# Patient Record
Sex: Female | Born: 1950 | Race: White | Hispanic: No | Marital: Married | Smoking: Former smoker
Health system: Southern US, Community
[De-identification: ages and names within clinical notes are randomized; demographics above are authoritative.]

## PROBLEM LIST (undated history)

## (undated) DIAGNOSIS — R32 Unspecified urinary incontinence: Secondary | ICD-10-CM

## (undated) DIAGNOSIS — T7840XA Allergy, unspecified, initial encounter: Secondary | ICD-10-CM

## (undated) DIAGNOSIS — F1921 Other psychoactive substance dependence, in remission: Secondary | ICD-10-CM

## (undated) DIAGNOSIS — F329 Major depressive disorder, single episode, unspecified: Secondary | ICD-10-CM

## (undated) DIAGNOSIS — F32A Depression, unspecified: Secondary | ICD-10-CM

## (undated) DIAGNOSIS — Z8619 Personal history of other infectious and parasitic diseases: Secondary | ICD-10-CM

## (undated) DIAGNOSIS — F419 Anxiety disorder, unspecified: Secondary | ICD-10-CM

## (undated) HISTORY — DX: Major depressive disorder, single episode, unspecified: F32.9

## (undated) HISTORY — DX: Personal history of other infectious and parasitic diseases: Z86.19

## (undated) HISTORY — DX: Unspecified urinary incontinence: R32

## (undated) HISTORY — DX: Allergy, unspecified, initial encounter: T78.40XA

## (undated) HISTORY — DX: Other psychoactive substance dependence, in remission: F19.21

## (undated) HISTORY — DX: Depression, unspecified: F32.A

## (undated) HISTORY — DX: Anxiety disorder, unspecified: F41.9

---

## 1998-07-23 ENCOUNTER — Other Ambulatory Visit: Admission: RE | Admit: 1998-07-23 | Discharge: 1998-07-23 | Payer: Self-pay | Admitting: Gynecology

## 1998-08-12 ENCOUNTER — Ambulatory Visit (HOSPITAL_BASED_OUTPATIENT_CLINIC_OR_DEPARTMENT_OTHER): Admission: RE | Admit: 1998-08-12 | Discharge: 1998-08-12 | Payer: Self-pay | Admitting: General Surgery

## 2001-08-16 DIAGNOSIS — D126 Benign neoplasm of colon, unspecified: Secondary | ICD-10-CM | POA: Insufficient documentation

## 2003-02-05 ENCOUNTER — Encounter: Payer: Self-pay | Admitting: Family Medicine

## 2003-02-05 ENCOUNTER — Encounter: Admission: RE | Admit: 2003-02-05 | Discharge: 2003-02-05 | Payer: Self-pay | Admitting: Family Medicine

## 2005-03-15 ENCOUNTER — Ambulatory Visit: Payer: Self-pay | Admitting: Internal Medicine

## 2005-03-26 ENCOUNTER — Encounter: Admission: RE | Admit: 2005-03-26 | Discharge: 2005-03-26 | Payer: Self-pay | Admitting: Internal Medicine

## 2005-08-16 DIAGNOSIS — F329 Major depressive disorder, single episode, unspecified: Secondary | ICD-10-CM | POA: Insufficient documentation

## 2005-08-20 ENCOUNTER — Ambulatory Visit: Payer: Self-pay | Admitting: Unknown Physician Specialty

## 2006-08-16 DIAGNOSIS — Z780 Asymptomatic menopausal state: Secondary | ICD-10-CM | POA: Insufficient documentation

## 2006-08-18 ENCOUNTER — Encounter: Admission: RE | Admit: 2006-08-18 | Discharge: 2006-08-18 | Payer: Self-pay | Admitting: Internal Medicine

## 2006-08-26 ENCOUNTER — Encounter: Admission: RE | Admit: 2006-08-26 | Discharge: 2006-08-26 | Payer: Self-pay | Admitting: Internal Medicine

## 2006-08-29 ENCOUNTER — Encounter (INDEPENDENT_AMBULATORY_CARE_PROVIDER_SITE_OTHER): Payer: Self-pay | Admitting: *Deleted

## 2006-08-29 ENCOUNTER — Encounter: Admission: RE | Admit: 2006-08-29 | Discharge: 2006-08-29 | Payer: Self-pay | Admitting: Internal Medicine

## 2006-08-29 HISTORY — PX: BREAST BIOPSY: SHX20

## 2007-04-18 ENCOUNTER — Other Ambulatory Visit: Admission: RE | Admit: 2007-04-18 | Discharge: 2007-04-18 | Payer: Self-pay | Admitting: *Deleted

## 2007-10-12 ENCOUNTER — Encounter: Admission: RE | Admit: 2007-10-12 | Discharge: 2007-10-12 | Payer: Self-pay | Admitting: Internal Medicine

## 2009-08-06 ENCOUNTER — Encounter: Admission: RE | Admit: 2009-08-06 | Discharge: 2009-08-06 | Payer: Self-pay | Admitting: Internal Medicine

## 2010-12-06 LAB — HM COLONOSCOPY: HM Colonoscopy: NORMAL

## 2010-12-26 ENCOUNTER — Encounter: Payer: Self-pay | Admitting: Internal Medicine

## 2010-12-27 ENCOUNTER — Encounter: Payer: Self-pay | Admitting: Internal Medicine

## 2010-12-28 ENCOUNTER — Other Ambulatory Visit: Payer: Self-pay | Admitting: Internal Medicine

## 2010-12-28 DIAGNOSIS — Z1239 Encounter for other screening for malignant neoplasm of breast: Secondary | ICD-10-CM

## 2011-01-21 ENCOUNTER — Ambulatory Visit
Admission: RE | Admit: 2011-01-21 | Discharge: 2011-01-21 | Disposition: A | Discharge status: Home / Self Care | Payer: 59 | Source: Ambulatory Visit | Attending: Internal Medicine | Admitting: Internal Medicine

## 2011-01-21 DIAGNOSIS — Z1239 Encounter for other screening for malignant neoplasm of breast: Secondary | ICD-10-CM

## 2012-02-11 ENCOUNTER — Other Ambulatory Visit: Payer: Self-pay | Admitting: Internal Medicine

## 2012-02-11 DIAGNOSIS — Z1231 Encounter for screening mammogram for malignant neoplasm of breast: Secondary | ICD-10-CM

## 2012-02-28 ENCOUNTER — Ambulatory Visit: Payer: 59

## 2012-04-25 ENCOUNTER — Ambulatory Visit: Payer: 59

## 2012-05-02 ENCOUNTER — Ambulatory Visit: Payer: 59

## 2012-08-16 DIAGNOSIS — N6019 Diffuse cystic mastopathy of unspecified breast: Secondary | ICD-10-CM | POA: Insufficient documentation

## 2012-12-11 ENCOUNTER — Ambulatory Visit: Payer: 59

## 2012-12-20 DIAGNOSIS — N3946 Mixed incontinence: Secondary | ICD-10-CM | POA: Insufficient documentation

## 2013-01-02 ENCOUNTER — Ambulatory Visit: Payer: 59

## 2013-04-10 ENCOUNTER — Other Ambulatory Visit: Payer: Self-pay

## 2013-04-10 DIAGNOSIS — Z1231 Encounter for screening mammogram for malignant neoplasm of breast: Secondary | ICD-10-CM

## 2013-05-09 ENCOUNTER — Ambulatory Visit: Payer: 59

## 2013-07-09 ENCOUNTER — Other Ambulatory Visit: Payer: Self-pay

## 2013-07-09 DIAGNOSIS — Z1231 Encounter for screening mammogram for malignant neoplasm of breast: Secondary | ICD-10-CM

## 2013-08-09 ENCOUNTER — Ambulatory Visit
Admission: RE | Admit: 2013-08-09 | Discharge: 2013-08-09 | Disposition: A | Discharge status: Home / Self Care | Payer: 59 | Source: Ambulatory Visit

## 2013-08-09 DIAGNOSIS — Z1231 Encounter for screening mammogram for malignant neoplasm of breast: Secondary | ICD-10-CM

## 2013-10-08 ENCOUNTER — Other Ambulatory Visit: Payer: Self-pay | Admitting: Orthopedic Surgery

## 2013-10-08 DIAGNOSIS — M545 Low back pain: Secondary | ICD-10-CM

## 2013-10-11 ENCOUNTER — Other Ambulatory Visit: Payer: BC Managed Care – PPO

## 2013-12-06 LAB — HM DEXA SCAN

## 2014-04-03 LAB — CBC AND DIFFERENTIAL
HEMATOCRIT: 44 % (ref 36–46)
HEMOGLOBIN: 14.3 g/dL (ref 12.0–16.0)
PLATELETS: 171 10*3/uL (ref 150–399)
WBC: 9.4 10^3/mL

## 2014-04-03 LAB — LIPID PANEL
Cholesterol: 158 mg/dL (ref 0–200)
HDL: 72 mg/dL — AB (ref 35–70)
LDL Cholesterol: 74 mg/dL
TRIGLYCERIDES: 63 mg/dL (ref 40–160)

## 2014-04-03 LAB — BASIC METABOLIC PANEL
BUN: 17 mg/dL (ref 4–21)
Creatinine: 0.5 mg/dL (ref 0.5–1.1)
Glucose: 92 mg/dL
POTASSIUM: 5 mmol/L (ref 3.4–5.3)
SODIUM: 135 mmol/L — AB (ref 137–147)

## 2014-04-03 LAB — HEPATIC FUNCTION PANEL
ALT: 16 U/L (ref 7–35)
AST: 17 U/L (ref 13–35)
Alkaline Phosphatase: 56 U/L (ref 25–125)
Bilirubin, Total: 0.9 mg/dL

## 2014-04-03 LAB — HM COLONOSCOPY

## 2014-04-03 LAB — TSH: TSH: 2.47 u[IU]/mL (ref 0.41–5.90)

## 2014-09-11 ENCOUNTER — Other Ambulatory Visit: Payer: Self-pay

## 2014-09-11 DIAGNOSIS — Z1231 Encounter for screening mammogram for malignant neoplasm of breast: Secondary | ICD-10-CM

## 2014-09-23 ENCOUNTER — Ambulatory Visit
Admission: RE | Admit: 2014-09-23 | Discharge: 2014-09-23 | Disposition: A | Discharge status: Home / Self Care | Payer: BC Managed Care – PPO | Source: Ambulatory Visit

## 2014-09-23 ENCOUNTER — Encounter (INDEPENDENT_AMBULATORY_CARE_PROVIDER_SITE_OTHER): Payer: Self-pay

## 2014-09-23 DIAGNOSIS — Z1231 Encounter for screening mammogram for malignant neoplasm of breast: Secondary | ICD-10-CM

## 2015-01-06 ENCOUNTER — Ambulatory Visit: Payer: BC Managed Care – PPO | Admitting: Internal Medicine

## 2015-03-07 ENCOUNTER — Encounter (INDEPENDENT_AMBULATORY_CARE_PROVIDER_SITE_OTHER): Payer: Self-pay

## 2015-03-07 ENCOUNTER — Encounter: Payer: Self-pay | Admitting: Internal Medicine

## 2015-03-07 ENCOUNTER — Ambulatory Visit (INDEPENDENT_AMBULATORY_CARE_PROVIDER_SITE_OTHER): Payer: BLUE CROSS/BLUE SHIELD | Admitting: Internal Medicine

## 2015-03-07 VITALS — BP 115/67 | HR 51 | Temp 97.8°F | Ht 65.0 in | Wt 126.0 lb

## 2015-03-07 DIAGNOSIS — Z91048 Other nonmedicinal substance allergy status: Secondary | ICD-10-CM | POA: Diagnosis not present

## 2015-03-07 DIAGNOSIS — R42 Dizziness and giddiness: Secondary | ICD-10-CM | POA: Diagnosis not present

## 2015-03-07 DIAGNOSIS — Z9109 Other allergy status, other than to drugs and biological substances: Secondary | ICD-10-CM

## 2015-03-07 DIAGNOSIS — F419 Anxiety disorder, unspecified: Secondary | ICD-10-CM

## 2015-03-07 DIAGNOSIS — Z8249 Family history of ischemic heart disease and other diseases of the circulatory system: Secondary | ICD-10-CM

## 2015-03-07 DIAGNOSIS — Z803 Family history of malignant neoplasm of breast: Secondary | ICD-10-CM

## 2015-03-07 DIAGNOSIS — Z0000 Encounter for general adult medical examination without abnormal findings: Secondary | ICD-10-CM

## 2015-03-07 NOTE — Progress Notes (Signed)
Pre visit review using our clinic review tool, if applicable. No additional management support is needed unless otherwise documented below in the visit note. 

## 2015-03-19 ENCOUNTER — Encounter: Payer: Self-pay | Admitting: Internal Medicine

## 2015-03-19 DIAGNOSIS — Z9109 Other allergy status, other than to drugs and biological substances: Secondary | ICD-10-CM | POA: Insufficient documentation

## 2015-03-19 DIAGNOSIS — Z803 Family history of malignant neoplasm of breast: Secondary | ICD-10-CM | POA: Insufficient documentation

## 2015-03-19 DIAGNOSIS — Z0000 Encounter for general adult medical examination without abnormal findings: Secondary | ICD-10-CM | POA: Insufficient documentation

## 2015-03-19 DIAGNOSIS — Z8249 Family history of ischemic heart disease and other diseases of the circulatory system: Secondary | ICD-10-CM | POA: Insufficient documentation

## 2015-03-19 NOTE — Assessment & Plan Note (Signed)
Currently stable.

## 2015-03-19 NOTE — Assessment & Plan Note (Signed)
Had MRI - negative.  Persistent light headedness.  Refer to neurology.

## 2015-03-19 NOTE — Progress Notes (Signed)
Patient ID: Mary Manning, female   DOB: 12-27-1950, 64 y.o.   MRN: [id number]   Subjective:    Patient ID: Mary Manning, female    DOB: 12-27-1950, 64 y.o.   MRN: [id number]  HPI  Patient here for to establish care.  Has been followed at [facility].  Has been getting her breast, pelvic and pap smears there.  Up to date.  States is 2007 - anxiety.  Father died.  Children leaving home and menopause.  Was placed on zoloft and clonazepam at that time.  Became addicted.  Came completely off through a treatment program.  Did well until recently.  Seeing Dr Dwan BoltPeter Perault.  Back on zoloft and now taking lamictal.  Feeling better.  Tries to stay active.  Swims 6 miles per week.  Has adjusted her diet.  Light headedness.  Persistent.  Constant.  No vision change.  Had negative MRI.  No cardiac symptoms with increased activity or exertion.  Breathing stable.     Past Medical History  Diagnosis Date  . Depression   . Allergy   . History of chicken pox   . Urine incontinence   . Drug addiction in remission   . Anxiety disorder     Outpatient Encounter Prescriptions as of 03/07/2015  Medication Sig  . LAMICTAL 100 MG tablet daily.  Marland Kitchen. ZOLOFT 50 MG tablet daily.    Review of Systems  Constitutional: Negative for appetite change and unexpected weight change.  HENT: Negative for congestion and sinus pressure.   Eyes: Negative for visual disturbance.  Respiratory: Negative for cough, chest tightness and shortness of breath.   Cardiovascular: Negative for chest pain, palpitations and leg swelling.  Gastrointestinal: Negative for nausea, vomiting, abdominal pain and diarrhea.  Genitourinary: Negative for dysuria and difficulty urinating.  Skin: Negative for color change and rash.  Neurological: Positive for light-headedness (persistent light headedness.  better in the evening.  ). Negative for dizziness and headaches.  Psychiatric/Behavioral:       History of anxiety and depression.   Doing better now.         Objective:     Pulse recheck:  52  Physical Exam  Constitutional: She appears well-developed and well-nourished. No distress.  HENT:  Right Ear: External ear normal.  Left Ear: External ear normal.  Nose: Nose normal.  Mouth/Throat: Oropharynx is clear and moist.  Neck: Neck supple. No thyromegaly present.  Cardiovascular: Normal rate and regular rhythm.   Pulmonary/Chest: Breath sounds normal. No respiratory distress. She has no wheezes.  Abdominal: Soft. Bowel sounds are normal. There is no tenderness.  Musculoskeletal: She exhibits no edema or tenderness.  Lymphadenopathy:    She has no cervical adenopathy.  Neurological: She is alert.  Skin: No rash noted. No erythema.  Psychiatric: She has a normal mood and affect. Her behavior is normal.    BP 115/67 mmHg  Pulse 51  Temp(Src) 97.8 F (36.6 C) (Oral)  Ht 5\' 5"  (1.651 m)  Wt 126 lb (57.153 kg)  BMI 20.97 kg/m2  SpO2 100% Wt Readings from Last 3 Encounters:  03/07/15 126 lb (57.153 kg)    Mm Screening Breast Tomo Bilateral  09/23/2014   CLINICAL DATA:  Screening.  EXAM: DIGITAL SCREENING BILATERAL MAMMOGRAM WITH 3D TOMO WITH CAD  COMPARISON:  Previous exam(s).  ACR Breast Density Category c: The breast tissue is heterogeneously dense, which may obscure small masses.  FINDINGS: There are no findings suspicious for malignancy. Images were processed with CAD.  IMPRESSION: No mammographic evidence of malignancy. A result letter of this screening mammogram will be mailed directly to the patient.  RECOMMENDATION: Screening mammogram in one year. (Code:SM-B-01Y)  BI-RADS CATEGORY  1: Negative.   Electronically Signed   By: Baird Lyons M.D.   On: 09/23/2014 16:11       Assessment & Plan:   Problem List Items Addressed This Visit    Anxiety    Seeing psychiatry now.  On zolot and lamictal.  Doing better.  Follow.       Relevant Medications   ZOLOFT 50 MG tablet   Environmental allergies     Currently stable.        Family history of brain aneurysm    Had MRI - negative.  Persistent light headedness.  Refer to neurology.       Family history of breast cancer    Up to date with mammogram.  Mammogram 09/23/14 - Birads I.       Health care maintenance    Had her physical 04/2014.  PAP - atrophy.  Some inflammation. HPV question.  Mammogram 09/23/14 - Birads I.  Bone density 2012.  Tetanus - 2009.  Colonoscopy 2 years ago.        Light headedness - Primary    Had previous MRI - negative.  Very active with no cardiac symptoms.  Has been evaluated previously.  No clear answer.  She request neurology referral for evaluation, given persistent and constant.        Relevant Orders   Ambulatory referral to Neurology     I spent 30 minutes with the patient and more than 50% of the time was spent in consultation regarding the above.     [place], MD

## 2015-03-19 NOTE — Assessment & Plan Note (Signed)
Had her physical 04/2014.  PAP - atrophy.  Some inflammation. HPV question.  Mammogram 09/23/14 - Birads I.  Bone density 2012.  Tetanus - 2009.  Colonoscopy 2 years ago.

## 2015-03-19 NOTE — Assessment & Plan Note (Signed)
Up to date with mammogram.  Mammogram 09/23/14 - Birads I.

## 2015-03-19 NOTE — Assessment & Plan Note (Signed)
Seeing psychiatry now.  On zolot and lamictal.  Doing better.  Follow.

## 2015-03-19 NOTE — Assessment & Plan Note (Signed)
Had previous MRI - negative.  Very active with no cardiac symptoms.  Has been evaluated previously.  No clear answer.  She request neurology referral for evaluation, given persistent and constant.

## 2015-04-08 LAB — HEPATIC FUNCTION PANEL
ALK PHOS: 65 U/L (ref 25–125)
ALT: 20 U/L (ref 7–35)
AST: 18 U/L (ref 13–35)
Bilirubin, Total: 0.6 mg/dL

## 2015-04-08 LAB — BASIC METABOLIC PANEL
BUN: 16 mg/dL (ref 4–21)
CREATININE: 0.6 mg/dL (ref 0.5–1.1)
GLUCOSE: 91 mg/dL
Potassium: 4 mmol/L (ref 3.4–5.3)
SODIUM: 139 mmol/L (ref 137–147)

## 2015-04-08 LAB — LIPID PANEL
Cholesterol: 193 mg/dL (ref 0–200)
HDL: 69 mg/dL (ref 35–70)
LDL Cholesterol: 113 mg/dL
Triglycerides: 55 mg/dL (ref 40–160)

## 2015-04-08 LAB — CBC AND DIFFERENTIAL
HEMATOCRIT: 41 % (ref 36–46)
Hemoglobin: 12.9 g/dL (ref 12.0–16.0)
PLATELETS: 169 10*3/uL (ref 150–399)
WBC: 6 10*3/mL

## 2015-04-24 ENCOUNTER — Encounter: Payer: Self-pay | Admitting: *Deleted

## 2015-07-24 ENCOUNTER — Ambulatory Visit (INDEPENDENT_AMBULATORY_CARE_PROVIDER_SITE_OTHER): Payer: BLUE CROSS/BLUE SHIELD | Admitting: Nurse Practitioner

## 2015-07-24 ENCOUNTER — Encounter: Payer: Self-pay | Admitting: Nurse Practitioner

## 2015-07-24 VITALS — BP 110/80 | HR 53 | Temp 98.6°F | Resp 14 | Ht 65.0 in | Wt 121.4 lb

## 2015-07-24 DIAGNOSIS — R197 Diarrhea, unspecified: Secondary | ICD-10-CM

## 2015-07-24 LAB — BASIC METABOLIC PANEL
BUN: 8 mg/dL (ref 6–23)
CALCIUM: 9.3 mg/dL (ref 8.4–10.5)
CO2: 29 meq/L (ref 19–32)
CREATININE: 0.64 mg/dL (ref 0.40–1.20)
Chloride: 101 mEq/L (ref 96–112)
GFR: 99.81 mL/min (ref >60.00)
GLUCOSE: 84 mg/dL (ref 70–99)
Potassium: 3.5 mEq/L (ref 3.5–5.1)
SODIUM: 137 meq/L (ref 135–145)

## 2015-07-24 LAB — CBC WITH DIFFERENTIAL/PLATELET
BASOS PCT: 0.3 % (ref 0.0–3.0)
Basophils Absolute: 0 10*3/uL (ref 0.0–0.1)
EOS PCT: 0.9 % (ref 0.0–5.0)
Eosinophils Absolute: 0 10*3/uL (ref 0.0–0.7)
HEMATOCRIT: 43 % (ref 36.0–46.0)
HEMOGLOBIN: 14.5 g/dL (ref 12.0–15.0)
LYMPHS PCT: 21.4 % (ref 12.0–46.0)
Lymphs Abs: 1.2 10*3/uL (ref 0.7–4.0)
MCHC: 33.7 g/dL (ref 30.0–36.0)
MCV: 90.7 fl (ref 78.0–100.0)
MONOS PCT: 15.4 % — AB (ref 3.0–12.0)
Monocytes Absolute: 0.8 10*3/uL (ref 0.1–1.0)
Neutro Abs: 3.3 10*3/uL (ref 1.4–7.7)
Neutrophils Relative %: 62 % (ref 43.0–77.0)
Platelets: 177 10*3/uL (ref 150.0–400.0)
RBC: 4.75 Mil/uL (ref 3.87–5.11)
RDW: 12.2 % (ref 11.5–15.5)
WBC: 5.4 10*3/uL (ref 4.0–10.5)

## 2015-07-24 NOTE — Progress Notes (Signed)
Patient ID: Mary Manning, female    DOB: 22-Dec-1950  Age: 64 y.o. MRN: [id number]  CC: Diarrhea   HPI Mary Manning presents for diarrhea 4 days.  1) Monday patient reports she was at the beach she had pork with her husband the night before on the grill and proceeded to have diarrhea 12 hours later patient reports vomiting at nighttime and vomiting only once on Tuesday she is feeling improved today and the diarrhea has subsided she has not taken anything. She reports a few days of up to 100 degree temperature. She is sticking to a bland diet and able to keep fluids down. She denies abdominal pain or nausea today.   History Mary Manning has a past medical history of Depression; Allergy; History of chicken pox; Urine incontinence; Drug addiction in remission; and Anxiety disorder.   She has past surgical history that includes Breast biopsy.   Her family history includes Aneurysm in her sister; Cancer in her sister; Hypertension in her father.She reports that she has quit smoking. She has never used smokeless tobacco. She reports that she drinks alcohol. She reports that she does not use illicit drugs.  Outpatient Prescriptions Prior to Visit  Medication Sig Dispense Refill  . LAMICTAL 100 MG tablet daily.  0  . ZOLOFT 50 MG tablet daily.  0   No facility-administered medications prior to visit.    ROS Review of Systems  Constitutional: Positive for fever. Negative for chills, diaphoresis and fatigue.  Respiratory: Negative for chest tightness, shortness of breath and wheezing.   Cardiovascular: Negative for chest pain, palpitations and leg swelling.  Gastrointestinal: Positive for vomiting and diarrhea. Negative for nausea, abdominal pain and abdominal distention.  Skin: Negative for rash.  Neurological: Negative for dizziness, weakness, numbness and headaches.  Psychiatric/Behavioral: The patient is not nervous/anxious.     Objective:  BP 110/80 mmHg  Pulse 53  Temp(Src) 98.6  F (37 C)  Resp 14  Ht  (1.651 m)  Wt 121 lb 6.4 oz (55.067 kg)  BMI 20.20 kg/m2  SpO2 98%  Physical Exam  Constitutional: She is oriented to person, place, and time. She appears well-developed and well-nourished. No distress.  HENT:  Head: Normocephalic and atraumatic.  Right Ear: External ear normal.  Left Ear: External ear normal.  Cardiovascular: Normal rate, regular rhythm and normal heart sounds.   Pulmonary/Chest: Effort normal and breath sounds normal. No respiratory distress. She has no wheezes. She has no rales. She exhibits no tenderness.  Abdominal: Soft. Bowel sounds are normal. She exhibits no distension and no mass. There is no tenderness. There is no rebound and no guarding.  Neurological: She is alert and oriented to person, place, and time. No cranial nerve deficit. She exhibits normal muscle tone. Coordination normal.  Skin: Skin is warm and dry. No rash noted. She is not diaphoretic.  Psychiatric: She has a normal mood and affect. Her behavior is normal. Judgment and thought content normal.   Assessment & Plan:   Cierria was seen today for diarrhea.  Diagnoses and all orders for this visit:  Diarrhea -     CBC with Differential/Platelet -     Basic Metabolic Panel (BMET)   I am having Ms. Mary Manning maintain her ZOLOFT and LAMICTAL.  No orders of the defined types were placed in this encounter.     Follow-up: Return if symptoms worsen or fail to improve.

## 2015-07-24 NOTE — Patient Instructions (Signed)

## 2015-07-24 NOTE — Assessment & Plan Note (Signed)
Probable viral illness. BMET and CBC w/ diff obtained today. Pt able to keep down fluids and bland food. Stable on conservative therapy. FU prn worsening/failure to improve.

## 2015-07-24 NOTE — Progress Notes (Signed)
Pre visit review using our clinic review tool, if applicable. No additional management support is needed unless otherwise documented below in the visit note. 

## 2015-07-28 ENCOUNTER — Telehealth: Payer: Self-pay | Admitting: Internal Medicine

## 2015-07-28 NOTE — Telephone Encounter (Signed)
She was seen by Lyla Son on 07/24/15.  If she is having persistent diarrhea, will need to be reevaluated.  I am not sure how long this has been going on or how many stools per day she is having.  Is she taking a probiotic daily?  Is she taking anything for diarrhea?

## 2015-07-28 NOTE — Telephone Encounter (Signed)
Labs were mailed to her, as they were normal.  Please advise?  Do you want to see her again?

## 2015-07-28 NOTE — Telephone Encounter (Signed)
Patient called requesting lab results, because her labs were normal and she is still experiencing diarrhea she wants to speak to a nurse.

## 2015-07-29 NOTE — Telephone Encounter (Signed)
Attempted to call patient for an update, no answer, unable to leave a message.

## 2015-07-29 NOTE — Telephone Encounter (Signed)
Spoke with the patient.  She stated that the diarrhea had last up until yesterday afternoon.  She was not taking any medications.  It was severe at one point with her sweating and she thought she was going to need to go to the ER.  Since it has stopped she has been able to hold a bland diet down.  She said she is feeling much better.  I gave her my extension to call back if her symptoms come back today or in the next few days.

## 2015-07-29 NOTE — Telephone Encounter (Signed)
Please try again and hold until receive info - or if any other contact available.

## 2015-12-16 ENCOUNTER — Other Ambulatory Visit: Payer: Self-pay

## 2015-12-16 DIAGNOSIS — Z1231 Encounter for screening mammogram for malignant neoplasm of breast: Secondary | ICD-10-CM

## 2016-01-08 ENCOUNTER — Ambulatory Visit
Admission: RE | Admit: 2016-01-08 | Discharge: 2016-01-08 | Disposition: A | Discharge status: Home / Self Care | Payer: BLUE CROSS/BLUE SHIELD | Source: Ambulatory Visit

## 2016-01-08 DIAGNOSIS — Z1231 Encounter for screening mammogram for malignant neoplasm of breast: Secondary | ICD-10-CM

## 2016-03-17 ENCOUNTER — Telehealth: Payer: Self-pay

## 2016-03-17 NOTE — Telephone Encounter (Signed)
Patient's pharmacy sent a Prior Authorization for Dymista nasal spray.  I don't see this on her medication list ? Please advise before I do the PA. Thanks.

## 2016-03-18 NOTE — Telephone Encounter (Signed)
I think this is the wrong Trista Ilg.  I ok'd a prescription for dymista on another pt yesterday.  Please check date of birth on [place]. request

## 2016-03-18 NOTE — Telephone Encounter (Signed)
Noted, thanks!

## 2016-12-16 ENCOUNTER — Other Ambulatory Visit: Payer: Self-pay | Admitting: Internal Medicine

## 2016-12-16 DIAGNOSIS — Z1231 Encounter for screening mammogram for malignant neoplasm of breast: Secondary | ICD-10-CM

## 2017-01-13 ENCOUNTER — Ambulatory Visit
Admission: RE | Admit: 2017-01-13 | Discharge: 2017-01-13 | Disposition: A | Discharge status: Home / Self Care | Payer: BLUE CROSS/BLUE SHIELD | Source: Ambulatory Visit | Attending: Internal Medicine | Admitting: Internal Medicine

## 2017-01-13 DIAGNOSIS — Z1231 Encounter for screening mammogram for malignant neoplasm of breast: Secondary | ICD-10-CM

## 2017-08-09 ENCOUNTER — Other Ambulatory Visit: Payer: Self-pay | Admitting: Internal Medicine

## 2017-08-09 DIAGNOSIS — N6320 Unspecified lump in the left breast, unspecified quadrant: Secondary | ICD-10-CM

## 2017-08-22 ENCOUNTER — Ambulatory Visit
Admission: RE | Admit: 2017-08-22 | Discharge: 2017-08-22 | Disposition: A | Discharge status: Home / Self Care | Payer: BLUE CROSS/BLUE SHIELD | Source: Ambulatory Visit | Attending: Internal Medicine | Admitting: Internal Medicine

## 2017-08-22 DIAGNOSIS — N6320 Unspecified lump in the left breast, unspecified quadrant: Secondary | ICD-10-CM

## 2017-12-20 ENCOUNTER — Other Ambulatory Visit: Payer: Self-pay | Admitting: Internal Medicine

## 2017-12-20 DIAGNOSIS — Z139 Encounter for screening, unspecified: Secondary | ICD-10-CM

## 2018-01-16 ENCOUNTER — Ambulatory Visit: Payer: BLUE CROSS/BLUE SHIELD

## 2018-02-13 ENCOUNTER — Ambulatory Visit: Payer: BLUE CROSS/BLUE SHIELD

## 2018-04-19 ENCOUNTER — Ambulatory Visit
Admission: RE | Admit: 2018-04-19 | Discharge: 2018-04-19 | Disposition: A | Discharge status: Home / Self Care | Payer: Medicare HMO | Source: Ambulatory Visit | Attending: Internal Medicine | Admitting: Internal Medicine

## 2018-04-19 DIAGNOSIS — Z139 Encounter for screening, unspecified: Secondary | ICD-10-CM

## 2018-08-07 ENCOUNTER — Encounter: Payer: Self-pay | Admitting: Gynecology

## 2018-08-07 ENCOUNTER — Ambulatory Visit
Admission: EM | Admit: 2018-08-07 | Discharge: 2018-08-07 | Disposition: A | Discharge status: Home / Self Care | Payer: Medicare HMO | Attending: Family Medicine | Admitting: Family Medicine

## 2018-08-07 ENCOUNTER — Other Ambulatory Visit: Payer: Self-pay

## 2018-08-07 DIAGNOSIS — J029 Acute pharyngitis, unspecified: Secondary | ICD-10-CM | POA: Diagnosis not present

## 2018-08-07 DIAGNOSIS — R509 Fever, unspecified: Secondary | ICD-10-CM | POA: Diagnosis not present

## 2018-08-07 DIAGNOSIS — R7989 Other specified abnormal findings of blood chemistry: Secondary | ICD-10-CM

## 2018-08-07 DIAGNOSIS — R945 Abnormal results of liver function studies: Secondary | ICD-10-CM

## 2018-08-07 DIAGNOSIS — M791 Myalgia, unspecified site: Secondary | ICD-10-CM

## 2018-08-07 DIAGNOSIS — R748 Abnormal levels of other serum enzymes: Secondary | ICD-10-CM

## 2018-08-07 LAB — URINALYSIS, COMPLETE (UACMP) WITH MICROSCOPIC
Bacteria, UA: NONE SEEN
Bilirubin Urine: NEGATIVE
GLUCOSE, UA: NEGATIVE mg/dL
Ketones, ur: NEGATIVE mg/dL
Leukocytes, UA: NEGATIVE
NITRITE: NEGATIVE
Protein, ur: NEGATIVE mg/dL
RBC / HPF: NONE SEEN RBC/hpf (ref 0–5)
SQUAMOUS EPITHELIAL / LPF: NONE SEEN (ref 0–5)
pH: 6 (ref 5.0–8.0)

## 2018-08-07 LAB — COMPREHENSIVE METABOLIC PANEL
ALT: 198 U/L — ABNORMAL HIGH (ref 0–44)
AST: 163 U/L — AB (ref 15–41)
Albumin: 4 g/dL (ref 3.5–5.0)
Alkaline Phosphatase: 314 U/L — ABNORMAL HIGH (ref 38–126)
Anion gap: 10 (ref 5–15)
BILIRUBIN TOTAL: 0.7 mg/dL (ref 0.3–1.2)
BUN: 9 mg/dL (ref 8–23)
CO2: 25 mmol/L (ref 22–32)
CREATININE: 0.63 mg/dL (ref 0.44–1.00)
Calcium: 9 mg/dL (ref 8.9–10.3)
Chloride: 102 mmol/L (ref 98–111)
Glucose, Bld: 90 mg/dL (ref 70–99)
POTASSIUM: 3.9 mmol/L (ref 3.5–5.1)
Sodium: 137 mmol/L (ref 135–145)
TOTAL PROTEIN: 8 g/dL (ref 6.5–8.1)

## 2018-08-07 LAB — CBC WITH DIFFERENTIAL/PLATELET
BASOS ABS: 0 10*3/uL (ref 0–0.1)
Basophils Relative: 1 %
EOS PCT: 1 %
Eosinophils Absolute: 0 10*3/uL (ref 0–0.7)
HEMATOCRIT: 44.1 % (ref 35.0–47.0)
Hemoglobin: 14.5 g/dL (ref 12.0–16.0)
LYMPHS ABS: 0.5 10*3/uL — AB (ref 1.0–3.6)
LYMPHS PCT: 17 %
MCH: 29.7 pg (ref 26.0–34.0)
MCHC: 32.9 g/dL (ref 32.0–36.0)
MCV: 90.2 fL (ref 80.0–100.0)
MONO ABS: 0.3 10*3/uL (ref 0.2–0.9)
MONOS PCT: 10 %
NEUTROS ABS: 2.1 10*3/uL (ref 1.4–6.5)
Neutrophils Relative %: 71 %
Platelets: 109 10*3/uL — ABNORMAL LOW (ref 150–440)
RBC: 4.88 MIL/uL (ref 3.80–5.20)
RDW: 12.9 % (ref 11.5–14.5)
WBC: 3 10*3/uL — ABNORMAL LOW (ref 3.6–11.0)

## 2018-08-07 LAB — RAPID INFLUENZA A&B ANTIGENS
Influenza A (ARMC): NEGATIVE
Influenza B (ARMC): NEGATIVE

## 2018-08-07 LAB — GAMMA GT: GGT: 186 U/L — ABNORMAL HIGH (ref 7–50)

## 2018-08-07 LAB — RAPID STREP SCREEN (MED CTR MEBANE ONLY): Streptococcus, Group A Screen (Direct): NEGATIVE

## 2018-08-07 NOTE — ED Triage Notes (Signed)
Patient c/o fever of 101.5 at home x 4 days. Per patient fever today of 100.2. Patient stated no other symptoms.

## 2018-08-07 NOTE — ED Provider Notes (Signed)
[facility]    CSN: [id number] Arrival date & time: 08/07/18  1704  History   Chief Complaint Chief Complaint  Patient presents with  . Fever   HPI  67 year old female presents with fever.  She reports a 4-day history of fever.  T-max 101.5.  Temperature was 100.2 earlier today.  Patient reports mild sore throat, chills, body aches.  Denies urinary symptoms.  Denies cough.  Does have a history of urinary incontinence.  No abdominal pain.  No other reported symptoms.  She is been using Tylenol with improvement.  No known exacerbating factors.  No other associated symptoms.  No other complaints.  PMH, Surgical Hx, Family Hx, Social History reviewed and updated as below.  Past Medical History:  Diagnosis Date  . Allergy   . Anxiety disorder   . Depression   . Drug addiction in remission ([place])   . History of chicken pox   . Urine incontinence    Patient Active Problem List   Diagnosis Date Noted  . Diarrhea 07/24/2015  . Anxiety 03/19/2015  . Environmental allergies 03/19/2015  . Family history of breast cancer 03/19/2015  . Family history of brain aneurysm 03/19/2015  . Health care maintenance 03/19/2015  . Light headedness 03/07/2015    Past Surgical History:  Procedure Laterality Date  . BREAST BIOPSY Right 08/29/2006    OB History   None      Home Medications    Prior to Admission medications   Medication Sig Start Date End Date Taking? Authorizing Provider  LAMICTAL 100 MG tablet daily. 02/28/15  Yes [provider]  ZOLOFT 50 MG tablet daily. 02/28/15  Yes [provider]    Family History Family History  Problem Relation Age of Onset  . Hypertension Father   . Cancer Sister        Breast Cancer  . Breast cancer Sister 14  . Aneurysm Sister        brain    Social History Social History   Tobacco Use  . Smoking status: Former Research scientist (life sciences)  . Smokeless tobacco: Never Used  Substance Use Topics  . Alcohol use: Yes   Alcohol/week: 0.0 standard drinks    Comment: wine  . Drug use: No     Allergies   Codeine   Review of Systems Review of Systems  Constitutional: Positive for chills and fever.  HENT: Positive for sore throat.   Respiratory: Negative for cough.   Gastrointestinal: Negative.   Genitourinary: Negative.   Musculoskeletal:       Body aches.   Physical Exam Triage Vital Signs ED Triage Vitals  Enc Vitals Group     BP 08/07/18 1725 (!) 118/52     Pulse Rate 08/07/18 1725 62     Resp 08/07/18 1725 16     Temp 08/07/18 1725 99.5 F (37.5 C)     Temp Source 08/07/18 1725 Oral     SpO2 08/07/18 1725 99 %     Weight 08/07/18 1720 125 lb (56.7 kg)     Height 08/07/18 1720 5' 6"  (1.676 m)     Head Circumference --      Peak Flow --      Pain Score 08/07/18 1720 0     Pain Loc --      Pain Edu? --      Excl. in [place]? --    Updated Vital Signs BP (!) 118/52 (BP Location: Left Arm)   Pulse 62   Temp 99.5  F (37.5 C) (Oral)   Resp 16   Ht 5' 6"  (1.676 m)   Wt 56.7 kg   SpO2 99%   BMI 20.18 kg/m   Visual Acuity Right Eye Distance:   Left Eye Distance:   Bilateral Distance:    Right Eye Near:   Left Eye Near:    Bilateral Near:     Physical Exam  Constitutional: She is oriented to person, place, and time. She appears well-developed. No distress.  HENT:  Head: Normocephalic and atraumatic.  Mouth/Throat: Oropharynx is clear and moist.  Normal TM's.  Eyes: Conjunctivae are normal. Right eye exhibits no discharge. Left eye exhibits no discharge.  Neck: Neck supple.  Cardiovascular: Normal rate and regular rhythm.  Pulmonary/Chest: Effort normal and breath sounds normal. She has no wheezes. She has no rales.  Abdominal: Soft. She exhibits no distension. There is no tenderness. There is no rebound and no guarding.  Lymphadenopathy:    She has no cervical adenopathy.  Neurological: She is alert and oriented to person, place, and time.  Psychiatric: She has a normal  mood and affect. Her behavior is normal.  Nursing note and vitals reviewed.  UC Treatments / Results  Labs (all labs ordered are listed, but only abnormal results are displayed) Labs Reviewed  CBC WITH DIFFERENTIAL/PLATELET - Abnormal; Notable for the following components:      Result Value   WBC 3.0 (*)    Platelets 109 (*)    Lymphs Abs 0.5 (*)    All other components within normal limits  COMPREHENSIVE METABOLIC PANEL - Abnormal; Notable for the following components:   AST 163 (*)    ALT 198 (*)    Alkaline Phosphatase 314 (*)    All other components within normal limits  URINALYSIS, COMPLETE (UACMP) WITH MICROSCOPIC - Abnormal; Notable for the following components:   Specific Gravity, Urine <1.005 (*)    Hgb urine dipstick TRACE (*)    All other components within normal limits  RAPID INFLUENZA A&B ANTIGENS ([facility] ONLY)  RAPID STREP SCREEN ([facility] ONLY)  CULTURE, GROUP A STREP [facility])  GAMMA GT    EKG None  Radiology No results found.  Procedures Procedures (including critical care time)  Medications Ordered in UC Medications - No data to display  Initial Impression / Assessment and Plan / UC Course  I have reviewed the triage vital signs and the nursing notes.  Pertinent labs & imaging results that were available during my care of the patient were reviewed by me and considered in my medical decision making (see chart for details).    67 year old female presents with a suspected viral process.  Patient has a mildly elevated temperature currently but does not have a fever.  We discussed work-up regarding her fever and patient elected to proceed with urinalysis and blood work.  Patient declined chest x-ray.  Her exam is unrevealing.  Labs revealed a slightly low WBC count at 3.  Decreased lymphocyte count.  Elevated alk phos, AST, and ALT.  I clinically suspect that this is a viral process.  Advised to limit Tylenol.  Ibuprofen as needed.  Patient will see me in  the next few days for repeat evaluation and laboratory studies.  Additionally, her rapid strep and influenza testing was negative today.  Final Clinical Impressions(s) / UC Diagnoses   Final diagnoses:  Fever, unspecified fever cause  Elevated alkaline phosphatase level  Elevated LFTs     Discharge Instructions     Rest,  fluids.  Limit tylenol.  Ibuprofen as needed.  See me on Thurs or Sat.  If you worsen, go to the hospital.  Take care  Dr. Lacinda Axon     ED Prescriptions    None     Controlled Substance Prescriptions [place] Controlled Substance Registry consulted? Not Applicable   Coral Spikes, DO 08/07/18 1856

## 2018-08-07 NOTE — Discharge Instructions (Signed)
Rest, fluids.  Limit tylenol.  Ibuprofen as needed.  See me on Thurs or Sat.  If you worsen, go to the hospital.  Take care  Dr. Adriana Simas

## 2018-08-10 ENCOUNTER — Encounter: Payer: Self-pay | Admitting: Emergency Medicine

## 2018-08-10 ENCOUNTER — Other Ambulatory Visit: Payer: Self-pay

## 2018-08-10 ENCOUNTER — Ambulatory Visit
Admission: EM | Admit: 2018-08-10 | Discharge: 2018-08-10 | Disposition: A | Discharge status: Home / Self Care | Payer: Medicare HMO | Attending: Family Medicine | Admitting: Family Medicine

## 2018-08-10 DIAGNOSIS — R945 Abnormal results of liver function studies: Secondary | ICD-10-CM

## 2018-08-10 DIAGNOSIS — R7989 Other specified abnormal findings of blood chemistry: Secondary | ICD-10-CM

## 2018-08-10 DIAGNOSIS — R5383 Other fatigue: Secondary | ICD-10-CM

## 2018-08-10 DIAGNOSIS — R509 Fever, unspecified: Secondary | ICD-10-CM | POA: Diagnosis not present

## 2018-08-10 LAB — COMPREHENSIVE METABOLIC PANEL
ALBUMIN: 3.7 g/dL (ref 3.5–5.0)
ALT: 227 U/L — AB (ref 0–44)
AST: 206 U/L — AB (ref 15–41)
Alkaline Phosphatase: 212 U/L — ABNORMAL HIGH (ref 38–126)
Anion gap: 9 (ref 5–15)
BILIRUBIN TOTAL: 0.4 mg/dL (ref 0.3–1.2)
BUN: 16 mg/dL (ref 8–23)
CO2: 26 mmol/L (ref 22–32)
CREATININE: 0.61 mg/dL (ref 0.44–1.00)
Calcium: 9.2 mg/dL (ref 8.9–10.3)
Chloride: 102 mmol/L (ref 98–111)
GFR calc Af Amer: >60 mL/min (ref >60)
GFR calc non Af Amer: >60 mL/min (ref >60)
Glucose, Bld: 111 mg/dL — ABNORMAL HIGH (ref 70–99)
POTASSIUM: 4.6 mmol/L (ref 3.5–5.1)
Sodium: 137 mmol/L (ref 135–145)
TOTAL PROTEIN: 7.4 g/dL (ref 6.5–8.1)

## 2018-08-10 LAB — CBC WITH DIFFERENTIAL/PLATELET
BASOS PCT: 1 %
Basophils Absolute: 0 10*3/uL (ref 0–0.1)
Eosinophils Absolute: 0 10*3/uL (ref 0–0.7)
Eosinophils Relative: 1 %
HEMATOCRIT: 38 % (ref 35.0–47.0)
Hemoglobin: 12.4 g/dL (ref 12.0–16.0)
LYMPHS PCT: 31 %
Lymphs Abs: 1.7 10*3/uL (ref 1.0–3.6)
MCH: 29.7 pg (ref 26.0–34.0)
MCHC: 32.8 g/dL (ref 32.0–36.0)
MCV: 90.5 fL (ref 80.0–100.0)
MONO ABS: 0.8 10*3/uL (ref 0.2–0.9)
Monocytes Relative: 14 %
NEUTROS ABS: 2.9 10*3/uL (ref 1.4–6.5)
Neutrophils Relative %: 53 %
Platelets: 160 10*3/uL (ref 150–440)
RBC: 4.19 MIL/uL (ref 3.80–5.20)
RDW: 12.8 % (ref 11.5–14.5)
WBC: 5.5 10*3/uL (ref 3.6–11.0)

## 2018-08-10 LAB — CULTURE, GROUP A STREP (THRC)

## 2018-08-10 NOTE — ED Triage Notes (Signed)
Patient in today for follow up from 08/07/18 visit for fever and elevated liver enzymes.

## 2018-08-10 NOTE — ED Provider Notes (Addendum)
[facility]    CSN: [id number] Arrival date & time: 08/10/18  1308  History   Chief Complaint Chief Complaint  Patient presents with  . Follow-up   HPI  67 year old female presents for reevaluation regarding recent fever and abnormal laboratory studies.  Patient reports that her fever has improved but she has still had a temperature as high as 100.  She feels fatigued.  No more body aches or chills.  No sore throat currently.  No abdominal pain.  Patient states that she just does not feel well.  No cough.  No respiratory symptoms.  No other associated symptoms.  No other complaints or concerns at this time.  Patient presents for reevaluation and repeat laboratory studies.  PMH, Surgical Hx, Family Hx, Social History reviewed and updated as below.  Past Medical History:  Diagnosis Date  . Allergy   . Anxiety disorder   . Depression   . Drug addiction in remission ([place])   . History of chicken pox   . Urine incontinence    Patient Active Problem List   Diagnosis Date Noted  . Diarrhea 07/24/2015  . Anxiety 03/19/2015  . Environmental allergies 03/19/2015  . Family history of breast cancer 03/19/2015  . Family history of brain aneurysm 03/19/2015  . Health care maintenance 03/19/2015  . Light headedness 03/07/2015   Past Surgical History:  Procedure Laterality Date  . BREAST BIOPSY Right 08/29/2006   OB History   None    Home Medications    Prior to Admission medications   Medication Sig Start Date End Date Taking? Authorizing Provider  LAMICTAL 100 MG tablet daily. 02/28/15  Yes [provider]  ZOLOFT 50 MG tablet daily. 02/28/15  Yes [provider]    Family History Family History  Problem Relation Age of Onset  . Aneurysm Mother        brain  . Hypertension Father   . Cancer Sister        Breast Cancer  . Breast cancer Sister 39  . Tongue cancer Sister     Social History Social History   Tobacco Use  . Smoking status:  Former Smoker    Last attempt to quit: 08/10/1978    Years since quitting: 40.0  . Smokeless tobacco: Never Used  Substance Use Topics  . Alcohol use: Yes    Alcohol/week: 0.0 standard drinks    Comment: wine  . Drug use: No     Allergies   Codeine   Review of Systems Review of Systems  Constitutional: Positive for fatigue and fever.  Respiratory: Negative.   Gastrointestinal: Negative.    Physical Exam Triage Vital Signs ED Triage Vitals  Enc Vitals Group     BP 08/10/18 1331 (!) 114/47     Pulse Rate 08/10/18 1331 (!) 54     Resp 08/10/18 1331 16     Temp 08/10/18 1331 98.3 F (36.8 C)     Temp Source 08/10/18 1331 Oral     SpO2 08/10/18 1331 97 %     Weight 08/10/18 1331 125 lb (56.7 kg)     Height 08/10/18 1331 _0  (1.676 m)     Head Circumference --      Peak Flow --      Pain Score 08/10/18 1330 0     Pain Loc --      Pain Edu? --      Excl. in [place]? --    Updated Vital Signs BP (!) 114/47 (BP  Location: Left Arm)   Pulse (!) 54   Temp 98.3 F (36.8 C) (Oral)   Resp 16   Ht _0  (1.676 m)   Wt 56.7 kg   SpO2 97%   BMI 20.18 kg/m   Visual Acuity Right Eye Distance:   Left Eye Distance:   Bilateral Distance:    Right Eye Near:   Left Eye Near:    Bilateral Near:     Physical Exam  Constitutional: She is oriented to person, place, and time. She appears well-developed. No distress.  HENT:  Head: Normocephalic and atraumatic.  Cardiovascular: Regular rhythm.  Bradycardic.  Pulmonary/Chest: Effort normal and breath sounds normal. No respiratory distress. She has no wheezes. She has no rales.  Abdominal: Soft. She exhibits no distension. There is no tenderness.  Neurological: She is alert and oriented to person, place, and time.  Psychiatric: She has a normal mood and affect. Her behavior is normal.  Nursing note and vitals reviewed.  UC Treatments / Results  Labs (all labs ordered are listed, but only abnormal results are displayed) Labs  Reviewed  COMPREHENSIVE METABOLIC PANEL - Abnormal; Notable for the following components:      Result Value   Glucose, Bld 111 (*)    AST 206 (*)    ALT 227 (*)    Alkaline Phosphatase 212 (*)    All other components within normal limits  RESPIRATORY VIRUS PANEL  CBC WITH DIFFERENTIAL/PLATELET  HEPATITIS PANEL, ACUTE    EKG None  Radiology No results found.  Procedures Procedures (including critical care time)  Medications Ordered in UC Medications - No data to display  Initial Impression / Assessment and Plan / UC Course  I have reviewed the triage vital signs and the nursing notes.  Pertinent labs & imaging results that were available during my care of the patient were reviewed by me and considered in my medical decision making (see chart for details).    67 year old female presents for reevaluation.  Patient is afebrile today.  Looks well.  Exam unrevealing.  Repeat laboratory studies reveal persistently elevated LFTs, elevated alk phos.  Discussed case with GI.  Sending respiratory viral panel as well as hepatitis studies.  Sending patient for right upper quadrant ultrasound.  The etiology of her symptoms remains unclear at this time.  I do suspect a viral etiology.  Final Clinical Impressions(s) / UC Diagnoses   Final diagnoses:  Elevated LFTs  Fever, unspecified fever cause     Discharge Instructions     Liver enzymes remain elevated as well as alk phos.  CBC now normal.  Waiting on results of virus panel.  I will call with the [place] results.  Take care  Dr. Lacinda Axon     ED Prescriptions    None     Controlled Substance Prescriptions [place] Controlled Substance Registry consulted? Not Applicable   >30 minutes were spent face-to-face with the patient during this encounter and over half of that time was spent on counseling regarding DDx, work up, monitoring.   Coral Spikes, DO 08/10/18 [place], [place], DO 08/10/18 [place]

## 2018-08-10 NOTE — Discharge Instructions (Addendum)
Liver enzymes remain elevated as well as alk phos.  CBC now normal.  Waiting on results of virus panel.  I will call with the [place] results.  Take care  Dr. Lacinda Axon

## 2018-08-11 ENCOUNTER — Other Ambulatory Visit: Payer: Medicare HMO

## 2018-08-11 ENCOUNTER — Other Ambulatory Visit: Payer: Self-pay | Admitting: Family Medicine

## 2018-08-11 ENCOUNTER — Ambulatory Visit: Payer: Medicare HMO

## 2018-08-11 ENCOUNTER — Ambulatory Visit
Admission: RE | Admit: 2018-08-11 | Discharge: 2018-08-11 | Disposition: A | Discharge status: Home / Self Care | Payer: Medicare HMO | Source: Ambulatory Visit | Attending: Family Medicine | Admitting: Family Medicine

## 2018-08-11 DIAGNOSIS — R1011 Right upper quadrant pain: Secondary | ICD-10-CM | POA: Diagnosis present

## 2018-08-11 LAB — HEPATITIS PANEL, ACUTE
HCV Ab: <0.1 s/co ratio (ref 0.0–0.9)
HEP B C IGM: NEGATIVE
Hep A IgM: NEGATIVE
Hepatitis B Surface Ag: NEGATIVE

## 2018-08-12 LAB — RESPIRATORY VIRUS PANEL
Adenovirus: NEGATIVE
Influenza A: NEGATIVE
Influenza B: NEGATIVE
METAPNEUMOVIRUS: NEGATIVE
PARAINFLUENZA 1 A: NEGATIVE
PARAINFLUENZA 2 A: NEGATIVE
Parainfluenza 3: NEGATIVE
RHINOVIRUS: NEGATIVE
Respiratory Syncytial Virus A: NEGATIVE
Respiratory Syncytial Virus B: NEGATIVE

## 2018-08-15 ENCOUNTER — Encounter: Payer: Self-pay | Admitting: Gastroenterology

## 2018-08-15 ENCOUNTER — Encounter: Payer: Medicare HMO | Admitting: Gastroenterology

## 2018-08-15 ENCOUNTER — Telehealth: Payer: Self-pay | Admitting: Family Medicine

## 2018-08-15 NOTE — Telephone Encounter (Signed)
Placing referral to GI.  Everlene Other DO [facility]

## 2018-08-15 NOTE — Progress Notes (Signed)
Visit cancelled , patient has GI at [facility] and wants to go back

## 2019-06-25 ENCOUNTER — Other Ambulatory Visit: Payer: Self-pay | Admitting: Internal Medicine

## 2019-06-25 DIAGNOSIS — Z1231 Encounter for screening mammogram for malignant neoplasm of breast: Secondary | ICD-10-CM

## 2019-08-10 ENCOUNTER — Ambulatory Visit: Payer: Medicare HMO

## 2019-08-14 ENCOUNTER — Other Ambulatory Visit: Payer: Self-pay

## 2019-08-14 ENCOUNTER — Ambulatory Visit
Admission: RE | Admit: 2019-08-14 | Discharge: 2019-08-14 | Disposition: A | Discharge status: Home / Self Care | Payer: Medicare HMO | Source: Ambulatory Visit | Attending: Internal Medicine | Admitting: Internal Medicine

## 2019-08-14 DIAGNOSIS — Z1231 Encounter for screening mammogram for malignant neoplasm of breast: Secondary | ICD-10-CM

## 2019-10-31 ENCOUNTER — Other Ambulatory Visit: Payer: Self-pay | Admitting: Physician Assistant

## 2019-10-31 DIAGNOSIS — H93A2 Pulsatile tinnitus, left ear: Secondary | ICD-10-CM

## 2019-11-16 ENCOUNTER — Ambulatory Visit: Payer: Medicare HMO

## 2020-01-28 ENCOUNTER — Ambulatory Visit: Payer: Medicare HMO | Attending: Internal Medicine

## 2020-01-28 DIAGNOSIS — Z23 Encounter for immunization: Secondary | ICD-10-CM

## 2020-01-28 NOTE — Progress Notes (Signed)
   Covid-19 Vaccination Clinic  Name:  Mary Manning    MRN: [id number] DOB: Apr 04, 1951  01/28/2020  [name] was observed post Covid-19 immunization for 15 minutes without incidence. She was provided with Vaccine Information Sheet and instruction to access the V-Safe system.   [name] was instructed to call 911 with any severe reactions post vaccine: Marland Kitchen Difficulty breathing  . Swelling of your face and throat  . A fast heartbeat  . A bad rash all over your body  . Dizziness and weakness    Immunizations Administered    Name Date Dose VIS Date Route   Moderna COVID-19 Vaccine 01/28/2020  1:19 PM 0.5 mL 11/06/2019 Intramuscular   Manufacturer: Moderna   Lot: [id number]   NDC: 70141-030-13

## 2020-02-26 ENCOUNTER — Ambulatory Visit: Payer: Medicare HMO | Attending: Internal Medicine

## 2020-02-26 DIAGNOSIS — Z23 Encounter for immunization: Secondary | ICD-10-CM

## 2020-02-26 NOTE — Progress Notes (Signed)
   Covid-19 Vaccination Clinic  Name:  Mary Manning    MRN: [id number] DOB: [birth year]  02/26/2020  Mary Manning was observed post Covid-19 immunization for 15 minutes without incident. She was provided with Vaccine Information Sheet and instruction to access the V-Safe system.   Mary Manning was instructed to call 911 with any severe reactions post vaccine: Marland Kitchen Difficulty breathing  . Swelling of face and throat  . A fast heartbeat  . A bad rash all over body  . Dizziness and weakness   Immunizations Administered    Name Date Dose VIS Date Route   Moderna COVID-19 Vaccine 02/26/2020  1:17 PM 0.5 mL 11/06/2019 Intramuscular   Manufacturer: Gala Murdoch   Lot: [id number]   NDC: 28366-294-76

## 2020-03-13 ENCOUNTER — Other Ambulatory Visit: Payer: Self-pay

## 2020-03-13 ENCOUNTER — Ambulatory Visit
Admission: EM | Admit: 2020-03-13 | Discharge: 2020-03-13 | Disposition: A | Discharge status: Home / Self Care | Payer: Medicare HMO | Attending: Internal Medicine | Admitting: Internal Medicine

## 2020-03-13 ENCOUNTER — Encounter: Payer: Self-pay | Admitting: Emergency Medicine

## 2020-03-13 ENCOUNTER — Ambulatory Visit (INDEPENDENT_AMBULATORY_CARE_PROVIDER_SITE_OTHER): Payer: Medicare HMO

## 2020-03-13 DIAGNOSIS — S29011A Strain of muscle and tendon of front wall of thorax, initial encounter: Secondary | ICD-10-CM | POA: Diagnosis not present

## 2020-03-13 DIAGNOSIS — R0789 Other chest pain: Secondary | ICD-10-CM | POA: Diagnosis not present

## 2020-03-13 DIAGNOSIS — X500XXA Overexertion from strenuous movement or load, initial encounter: Secondary | ICD-10-CM

## 2020-03-13 MED ORDER — IBUPROFEN 400 MG PO TABS: 400.0000 mg | ORAL_TABLET | Freq: Four times a day (QID) | ORAL | 0 refills | Status: AC | PRN

## 2020-03-13 NOTE — ED Triage Notes (Signed)
Patient in today c/o left sided rib pain since Sunday (03/09/20). Patient states she was changing the bed linens and bent down and started having pain. No injury noted.

## 2020-03-13 NOTE — ED Provider Notes (Addendum)
[facility]    CSN: [id number] Arrival date & time: 03/13/20  1240      History   Chief Complaint Chief Complaint  Patient presents with  . rib cage pain    HPI Mary Manning is a 69 y.o. female comes to urgent care with left-sided chest pain of 4 days duration.  Patient was changing bed linens when the pain occurred.  She bent down to pick up some lemon and started having the pain.  Pain onset was sudden, sharp, aggravated by movement and relieved by sitting still.  No shortness of breath, cough or sputum production.  No rash on the skin.  Patient has not tried any over-the-counter medications.Marland Kitchen   HPI  Past Medical History:  Diagnosis Date  . Allergy   . Anxiety disorder   . Depression   . 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 History of chicken pox   . Urine incontinence     Patient Active Problem List   Diagnosis Date Noted  . Diarrhea 07/24/2015  . Environmental allergies 03/19/2015  . Family history of breast cancer 03/19/2015  . Family history of brain aneurysm 03/19/2015  . Health care maintenance 03/19/2015  . Light headedness 03/07/2015  . Mixed stress and urge urinary incontinence 12/20/2012  . Fibrocystic breast disease 08/16/2012  . Menopause 08/16/2006  . Osteoporosis 08/16/2006  . Anxiety and depression 08/16/2005  . Colon adenoma 08/16/2001    Past Surgical History:  Procedure Laterality Date  . BREAST BIOPSY Right 08/29/2006    OB History   No obstetric history on file.      Home Medications    Prior to Admission medications   Medication Sig Start Date End Date Taking? Authorizing Provider  LAMICTAL 100 MG tablet daily. 02/28/15  Yes [provider]  ZOLOFT 50 MG tablet daily. 02/28/15  Yes [provider]    Family History Family History  Problem Relation Age of Onset  . Aneurysm Mother        brain  . Hypertension Father   . Cancer Sister        Breast Cancer  . Breast cancer Sister 46  . Tongue  cancer Sister     Social History Social History   Tobacco Use  . Smoking status: Former Smoker    Quit date: 08/10/1978    Years since quitting: 41.6  . Smokeless tobacco: Never Used  Substance Use Topics  . Alcohol use: Not Currently    Alcohol/week: 0.0 standard drinks    Comment: wine  . Drug use: No     Allergies   Codeine   Review of Systems Review of Systems  Constitutional: Positive for activity change. Negative for chills, fatigue and fever.  HENT: Negative.   Respiratory: Negative for cough, chest tightness, shortness of breath and wheezing.   Cardiovascular: Positive for chest pain. Negative for palpitations.  Gastrointestinal: Negative.  Negative for abdominal pain, nausea and vomiting.  Genitourinary: Negative.   Skin: Negative.   Psychiatric/Behavioral: Negative for confusion and decreased concentration.     Physical Exam Triage Vital Signs ED Triage Vitals  Enc Vitals Group     BP 03/13/20 1258 (!) 147/75     Pulse Rate 03/13/20 1258 60     Resp 03/13/20 1258 18     Temp 03/13/20 1258 98.3 F (36.8 C)     Temp Source 03/13/20 1258 Oral     SpO2 03/13/20 1258 98 %     Weight 03/13/20 1259  140 lb (63.5 kg)     Height 03/13/20 1259 5' 5.5" (1.664 m)     Head Circumference --      Peak Flow --      Pain Score 03/13/20 1258 5     Pain Loc --      Pain Edu? --      Excl. in GC? --    No data found.  Updated Vital Signs BP (!) 147/75 (BP Location: Right Arm)   Pulse 60   Temp 98.3 F (36.8 C) (Oral)   Resp 18   Ht 5' 5.5" (1.664 m)   Wt 63.5 kg   SpO2 98%   BMI 22.94 kg/m   Visual Acuity Right Eye Distance:   Left Eye Distance:   Bilateral Distance:    Right Eye Near:   Left Eye Near:    Bilateral Near:     Physical Exam Vitals and nursing note reviewed.  Constitutional:      General: She is in acute distress.     Appearance: Normal appearance. She is not ill-appearing.  Cardiovascular:     Rate and Rhythm: Normal rate and  regular rhythm.     Pulses: Normal pulses.     Heart sounds: Normal heart sounds.  Pulmonary:     Effort: Pulmonary effort is normal. No respiratory distress.     Breath sounds: Normal breath sounds. No wheezing or rhonchi.  Abdominal:     General: Bowel sounds are normal. There is no distension.     Palpations: There is no mass.  Musculoskeletal:     Cervical back: Normal range of motion.     Comments: Point tenderness over the lower lateral left chest.  No bruises, rash or swelling noticed.  Lymphadenopathy:     Cervical: No cervical adenopathy.  Neurological:     Mental Status: She is alert.      UC Treatments / Results  Labs (all labs ordered are listed, but only abnormal results are displayed) Labs Reviewed - No data to display  EKG   Radiology No results found.  Procedures Procedures (including critical care time)  Medications Ordered in UC Medications - No data to display  Initial Impression / Assessment and Plan / UC Course  I have reviewed the triage vital signs and the nursing notes.  Pertinent labs & imaging results that were available during my care of the patient were reviewed by me and considered in my medical decision making (see chart for details).     1.  Left-sided chest pain likely intercostal muscle sprain: X-ray of the left ribs is negative for acute fracture Alternate Tylenol with Motrin Gentle stretching exercises Return precautions-if patient experiences worsening shortness of breath, pain or wheezing, patient is advised to return to urgent care to be reevaluated. Final Clinical Impressions(s) / UC Diagnoses   Final diagnoses:  None   Discharge Instructions   None    ED Prescriptions    None     PDMP not reviewed this encounter.   Merrilee Jansky, MD 03/13/20 1323    Merrilee Jansky, MD 03/13/20 1356

## 2020-03-24 ENCOUNTER — Other Ambulatory Visit: Payer: Self-pay

## 2020-03-24 ENCOUNTER — Ambulatory Visit (INDEPENDENT_AMBULATORY_CARE_PROVIDER_SITE_OTHER): Payer: Medicare HMO

## 2020-03-24 ENCOUNTER — Ambulatory Visit
Admission: EM | Admit: 2020-03-24 | Discharge: 2020-03-24 | Disposition: A | Discharge status: Home / Self Care | Payer: Medicare HMO | Attending: Emergency Medicine | Admitting: Emergency Medicine

## 2020-03-24 DIAGNOSIS — S93401A Sprain of unspecified ligament of right ankle, initial encounter: Secondary | ICD-10-CM

## 2020-03-24 DIAGNOSIS — W1842XA Slipping, tripping and stumbling without falling due to stepping into hole or opening, initial encounter: Secondary | ICD-10-CM | POA: Diagnosis not present

## 2020-03-24 DIAGNOSIS — M25571 Pain in right ankle and joints of right foot: Secondary | ICD-10-CM

## 2020-03-24 NOTE — Discharge Instructions (Addendum)
Your x-ray was negative for fracture or fluid in your ankle joint.  Wear the ASO at all times for at least the next several days to prevent you from reinjuring your ankle, ice for 20 minutes at a time, elevate above the heart as much as possible, activity as tolerated.  If it hurts, stop immediately.  Take 600 mg of ibuprofen combined with thousand milligrams of Tylenol 3-4 times a day as needed for pain.

## 2020-03-24 NOTE — ED Provider Notes (Signed)
HPI  SUBJECTIVE:  Mary Manning is a 69 y.o. female who presents with sharp, intermittent lateral right ankle pain occurring 3-1/2 hours prior to arrival.  Patient states that she was walking in her yard, and stepped in a hole, inverting her ankle.  She was unable to bear weight on it immediately after.  She denies swelling, bruising, erythema, numbness or tingling in the foot, foot pain.  She states the pain is present only with inversion.  She tried crutches, and a boot.  No alleviating factors.  Symptoms are worse with weightbearing.  She has a past medical history of osteoporosis.  No history of diabetes, hypertension, chronic kidney disease, history of right ankle injury.  She is not on any anticoagulants or antiplatelets.  LPF:[id number], Cecille Rubin, MD  Past Medical History:  Diagnosis Date  . Allergy   . Anxiety disorder   . Depression   . 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 History of chicken pox   . Urine incontinence     Past Surgical History:  Procedure Laterality Date  . BREAST BIOPSY Right 08/29/2006    Family History  Problem Relation Age of Onset  . Aneurysm Mother        brain  . Hypertension Father   . Cancer Sister        Breast Cancer  . Breast cancer Sister 75  . Tongue cancer Sister     Social History   Tobacco Use  . Smoking status: Former Smoker    Quit date: 08/10/1978    Years since quitting: 41.6  . Smokeless tobacco: Never Used  Substance Use Topics  . Alcohol use: Not Currently    Alcohol/week: 0.0 standard drinks    Comment: wine  . Drug use: No    No current facility-administered medications for this encounter.  Current Outpatient Medications:  .  ibuprofen (ADVIL) 400 MG tablet, Take 1 tablet (400 mg total) by mouth every 6 (six) hours as needed., Disp: 30 tablet, Rfl: 0 .  LAMICTAL 100 MG tablet, daily., Disp: , Rfl: 0 .  ZOLOFT 50 MG tablet, daily., Disp: , Rfl: 0  Allergies  Allergen Reactions  . Codeine Nausea And Vomiting and  Nausea Only    Other reaction(s): Vomiting     ROS  As noted in HPI.   Physical Exam  BP (!) 152/69 (BP Location: Right Arm)   Pulse 61   Temp 98.4 F (36.9 C) (Oral)   Resp 18   Ht 5' 0.5" (1.537 m)   Wt 61.2 kg   SpO2 100%   BMI 25.93 kg/m   Constitutional: Well developed, well nourished, no acute distress Eyes:  EOMI, conjunctiva normal bilaterally HENT: Normocephalic, atraumatic,mucus membranes moist Respiratory: Normal inspiratory effort Cardiovascular: Normal rate GI: nondistended skin: No rash, skin intact Musculoskeletal: R  Ankle, Proximal fibula NT , Distal fibula NT, Medial malleolus NT,  Deltoid ligament medially NT ,  Lateral ligaments NT, ATFL laterally NT, calcaneofibular ligament laterally NT, posterior tablofibular ligament laterally NT ,  Achilles NT, calcaneus NT,  Proximal 5th metatarsal NT, Midfoot NT, distal NVI with baseline sensation / motor to foot with CR<2 seconds. no pain with dorsiflexion/plantar flexion.  Pain with inversion.  No pain with eversion. - bruising. - squeeze test.  Ant drawer test stable . Pt  not able to bear weight in dept.  Neurologic: Alert & oriented x 3, no focal neuro deficits Psychiatric: Speech and behavior appropriate   ED Course   Medications - No  data to display  Orders Placed This Encounter  Procedures  . DG Ankle Complete Right    Standing Status:   Standing    Number of Occurrences:   1    Order Specific Question:   Reason for Exam (SYMPTOM  OR DIAGNOSIS REQUIRED)    Answer:   s/p twisted ankle, pain  . Apply ASO ankle    Standing Status:   Standing    Number of Occurrences:   1    Order Specific Question:   Laterality    Answer:   Right    No results found for this or any previous visit (from the past 24 hour(s)). DG Ankle Complete Right  Result Date: 03/24/2020 CLINICAL DATA:  Acute right ankle pain after twisting injury. EXAM: RIGHT ANKLE - COMPLETE 3+ VIEW COMPARISON:  None. FINDINGS: No acute  fracture or dislocation. The ankle mortise is symmetric. The talar dome is intact. No tibiotalar joint effusion. Joint spaces are preserved. Mild osteopenia. Soft tissues are unremarkable. IMPRESSION: Negative. Electronically Signed   By: Obie Dredge M.D.   On: 03/24/2020 19:24    ED Clinical Impression  1. Sprain of right ankle, unspecified ligament, initial encounter      ED Assessment/Plan  Patient does not meet [place] ankle rules.  She is unable to bear weight here.  Will x-ray ankle especially given history of osteopenia to rule out fracture.  Reviewed imaging independently.  No fracture, effusion, dislocation.  Mild osteopenia.  See radiology report for full details.  Patient with a right ankle sprain.  X-ray is negative for fracture, effusion.  Patient brought in crutches.  She declined pain medication here.  Will place in an ASO, ice for 20 minutes at a time, elevate above the heart as much as possible, relative rest.  600 mg of ibuprofen combined with 1000 milligrams of Tylenol 3-4 times a day as needed for pain.  Patient declined prescription of ibuprofen.  Follow-up with [facility] if not better in 2 weeks for reevaluation and possible physical therapy.  Discussed  imaging, MDM, treatment plan, and plan for follow-up with patient.  patient agrees with plan.   No orders of the defined types were placed in this encounter.   *This clinic note was created using Dragon dictation software. Therefore, there may be occasional mistakes despite careful proofreading.   ?     Domenick Gong, MD 03/25/20 [phone]

## 2020-03-24 NOTE — ED Triage Notes (Signed)
Patient states that around 4pm she was around her yard to give someone a check and twisted her right ankle. States that the pain is worse with certain movements.

## 2020-07-14 ENCOUNTER — Other Ambulatory Visit: Payer: Self-pay | Admitting: Internal Medicine

## 2020-07-14 DIAGNOSIS — Z1231 Encounter for screening mammogram for malignant neoplasm of breast: Secondary | ICD-10-CM

## 2020-08-15 ENCOUNTER — Ambulatory Visit: Payer: Medicare HMO

## 2020-08-19 ENCOUNTER — Ambulatory Visit
Admission: RE | Admit: 2020-08-19 | Discharge: 2020-08-19 | Disposition: A | Discharge status: Home / Self Care | Payer: Medicare HMO | Source: Ambulatory Visit | Attending: Internal Medicine | Admitting: Internal Medicine

## 2020-08-19 ENCOUNTER — Other Ambulatory Visit: Payer: Self-pay

## 2020-08-19 DIAGNOSIS — Z1231 Encounter for screening mammogram for malignant neoplasm of breast: Secondary | ICD-10-CM

## 2020-08-21 ENCOUNTER — Other Ambulatory Visit: Payer: Self-pay | Admitting: Internal Medicine

## 2020-08-21 DIAGNOSIS — R928 Other abnormal and inconclusive findings on diagnostic imaging of breast: Secondary | ICD-10-CM

## 2020-09-03 ENCOUNTER — Ambulatory Visit
Admission: RE | Admit: 2020-09-03 | Discharge: 2020-09-03 | Disposition: A | Discharge status: Home / Self Care | Payer: Medicare HMO | Source: Ambulatory Visit | Attending: Internal Medicine | Admitting: Internal Medicine

## 2020-09-03 ENCOUNTER — Other Ambulatory Visit: Payer: Self-pay

## 2020-09-03 ENCOUNTER — Ambulatory Visit: Payer: Medicare HMO

## 2020-09-03 DIAGNOSIS — R928 Other abnormal and inconclusive findings on diagnostic imaging of breast: Secondary | ICD-10-CM

## 2021-01-26 DIAGNOSIS — Z1329 Encounter for screening for other suspected endocrine disorder: Secondary | ICD-10-CM | POA: Diagnosis not present

## 2021-01-26 DIAGNOSIS — Z0000 Encounter for general adult medical examination without abnormal findings: Secondary | ICD-10-CM | POA: Diagnosis not present

## 2021-02-06 DIAGNOSIS — Z0000 Encounter for general adult medical examination without abnormal findings: Secondary | ICD-10-CM | POA: Diagnosis not present

## 2021-02-06 DIAGNOSIS — R631 Polydipsia: Secondary | ICD-10-CM | POA: Diagnosis not present

## 2021-04-29 DIAGNOSIS — Z1283 Encounter for screening for malignant neoplasm of skin: Secondary | ICD-10-CM | POA: Diagnosis not present

## 2021-04-29 DIAGNOSIS — Z136 Encounter for screening for cardiovascular disorders: Secondary | ICD-10-CM | POA: Diagnosis not present

## 2021-04-29 DIAGNOSIS — Z1211 Encounter for screening for malignant neoplasm of colon: Secondary | ICD-10-CM | POA: Diagnosis not present

## 2021-04-29 DIAGNOSIS — Z780 Asymptomatic menopausal state: Secondary | ICD-10-CM | POA: Diagnosis not present

## 2021-04-29 DIAGNOSIS — Z23 Encounter for immunization: Secondary | ICD-10-CM | POA: Diagnosis not present

## 2021-04-29 DIAGNOSIS — M8000XA Age-related osteoporosis with current pathological fracture, unspecified site, initial encounter for fracture: Secondary | ICD-10-CM | POA: Diagnosis not present

## 2021-04-29 DIAGNOSIS — Z13220 Encounter for screening for lipoid disorders: Secondary | ICD-10-CM | POA: Diagnosis not present

## 2021-04-29 DIAGNOSIS — R69 Illness, unspecified: Secondary | ICD-10-CM | POA: Diagnosis not present

## 2021-04-29 DIAGNOSIS — Z0000 Encounter for general adult medical examination without abnormal findings: Secondary | ICD-10-CM | POA: Diagnosis not present

## 2021-09-23 ENCOUNTER — Other Ambulatory Visit: Payer: Self-pay | Admitting: Internal Medicine

## 2021-09-23 DIAGNOSIS — Z1231 Encounter for screening mammogram for malignant neoplasm of breast: Secondary | ICD-10-CM

## 2021-10-23 ENCOUNTER — Ambulatory Visit: Payer: Medicare HMO

## 2021-12-25 ENCOUNTER — Ambulatory Visit: Payer: Medicare HMO

## 2022-01-08 ENCOUNTER — Other Ambulatory Visit: Payer: Self-pay

## 2022-01-08 ENCOUNTER — Ambulatory Visit
Admission: RE | Admit: 2022-01-08 | Discharge: 2022-01-08 | Disposition: A | Discharge status: Home / Self Care | Payer: Medicare HMO | Source: Ambulatory Visit | Attending: Internal Medicine | Admitting: Internal Medicine

## 2022-01-08 DIAGNOSIS — Z1231 Encounter for screening mammogram for malignant neoplasm of breast: Secondary | ICD-10-CM

## 2023-02-15 ENCOUNTER — Other Ambulatory Visit: Payer: Self-pay | Admitting: Internal Medicine

## 2023-02-15 DIAGNOSIS — Z1231 Encounter for screening mammogram for malignant neoplasm of breast: Secondary | ICD-10-CM

## 2023-04-18 ENCOUNTER — Ambulatory Visit: Payer: Medicare HMO

## 2023-04-25 ENCOUNTER — Ambulatory Visit
Admission: RE | Admit: 2023-04-25 | Discharge: 2023-04-25 | Disposition: A | Discharge status: Home / Self Care | Payer: Medicare HMO | Source: Ambulatory Visit | Attending: Internal Medicine | Admitting: Internal Medicine

## 2023-04-25 DIAGNOSIS — Z1231 Encounter for screening mammogram for malignant neoplasm of breast: Secondary | ICD-10-CM

## 2023-05-05 ENCOUNTER — Ambulatory Visit: Payer: Medicare HMO

## 2023-10-22 IMAGING — MG MM DIGITAL SCREENING BILAT W/ TOMO AND CAD
8 series · 9 of 24 positions shown · non-contrast
Comparison: Previous exam(s).

CLINICAL DATA: Screening.

EXAM:
DIGITAL SCREENING BILATERAL MAMMOGRAM WITH TOMOSYNTHESIS AND CAD
TECHNIQUE: Bilateral screening digital craniocaudal and mediolateral oblique
mammograms were obtained. Bilateral screening digital breast
tomosynthesis was performed. The images were evaluated with
computer-aided detection.

[L CC synth-2D]
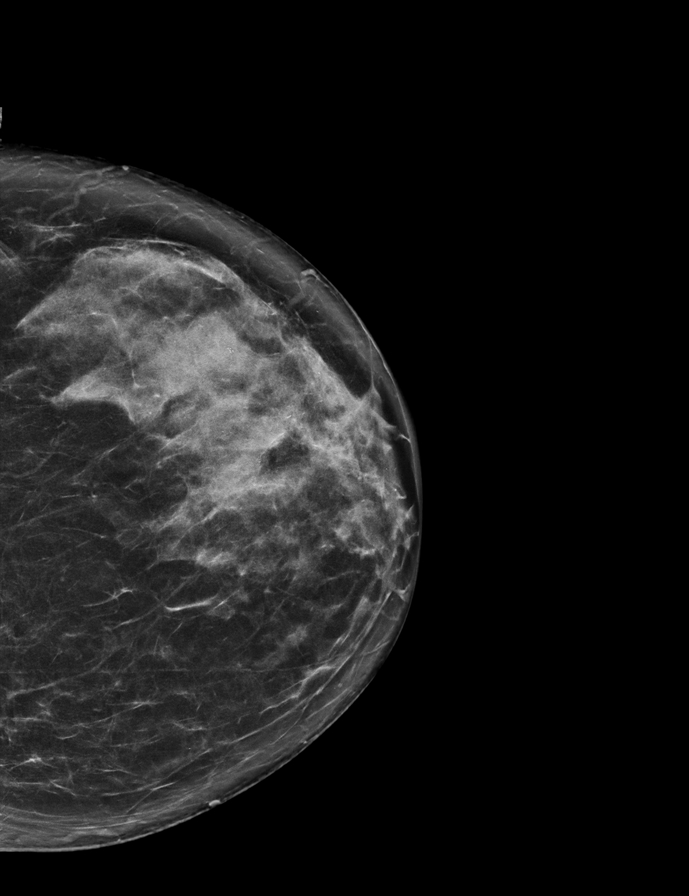

[L MLO synth-2D]
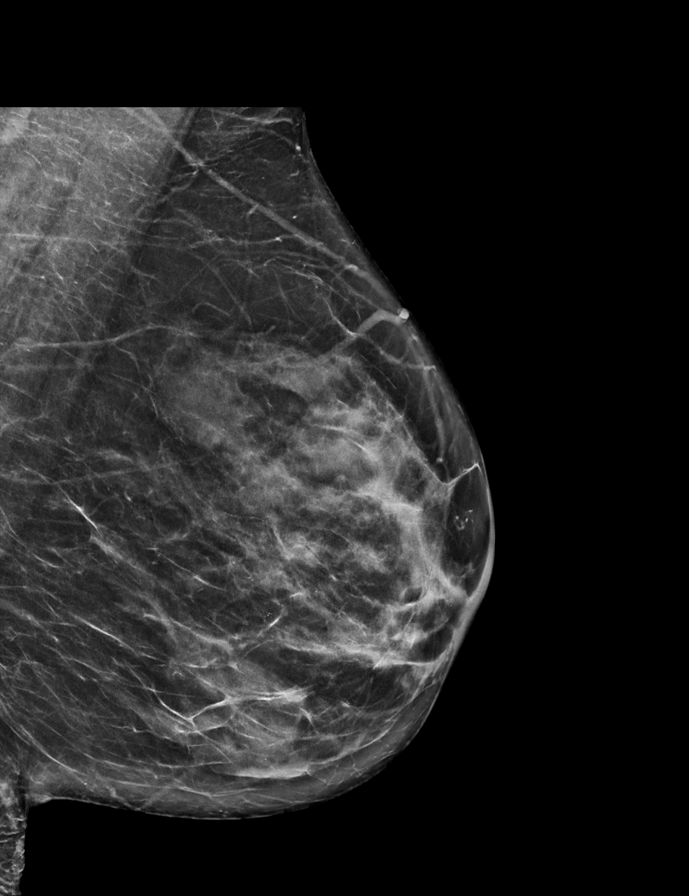

[R CC synth-2D]
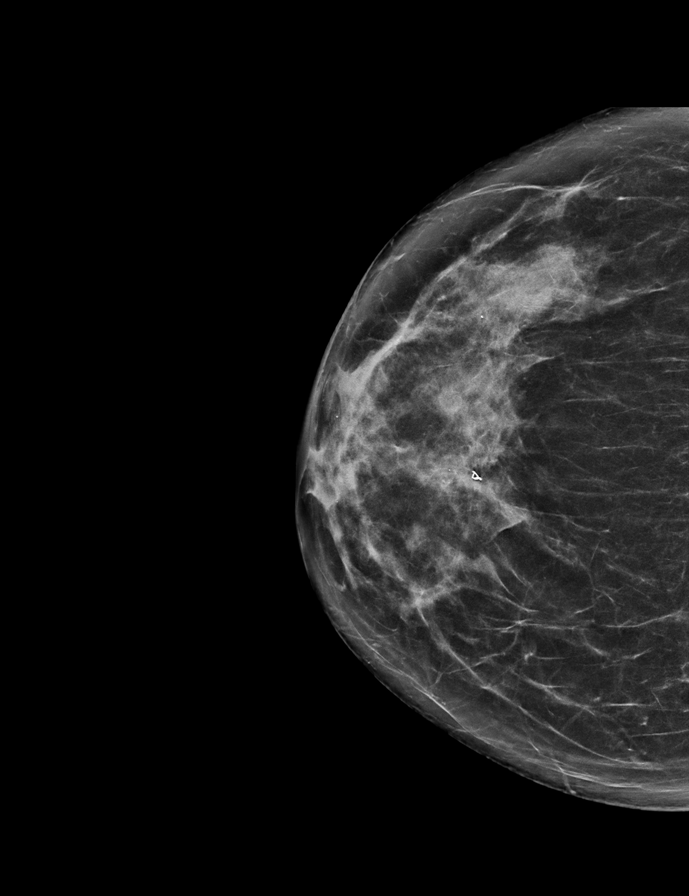

[R MLO synth-2D]
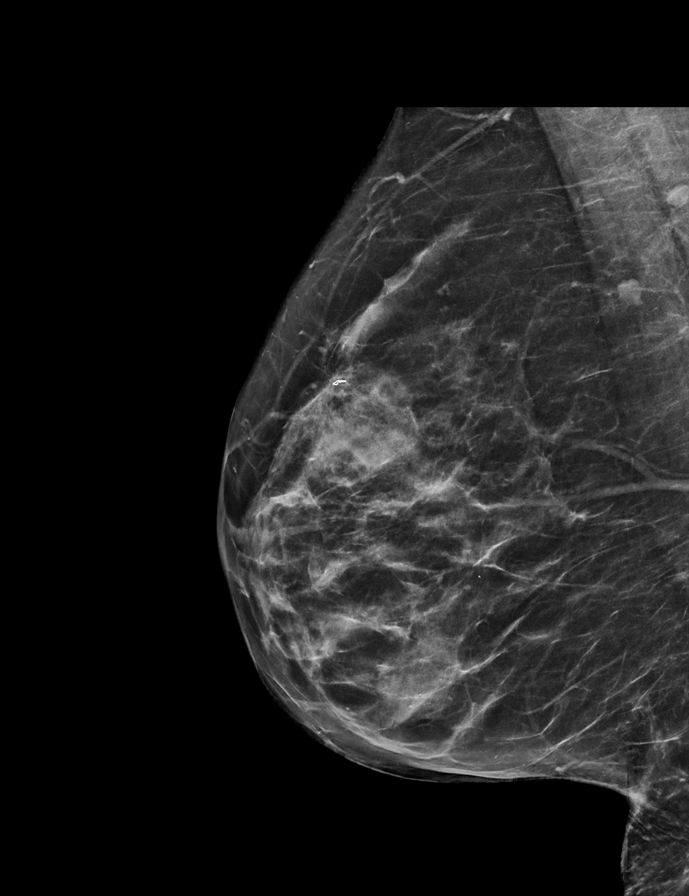

[L MLO tomo · 2 of 67 frames shown]
[frame 22/67]
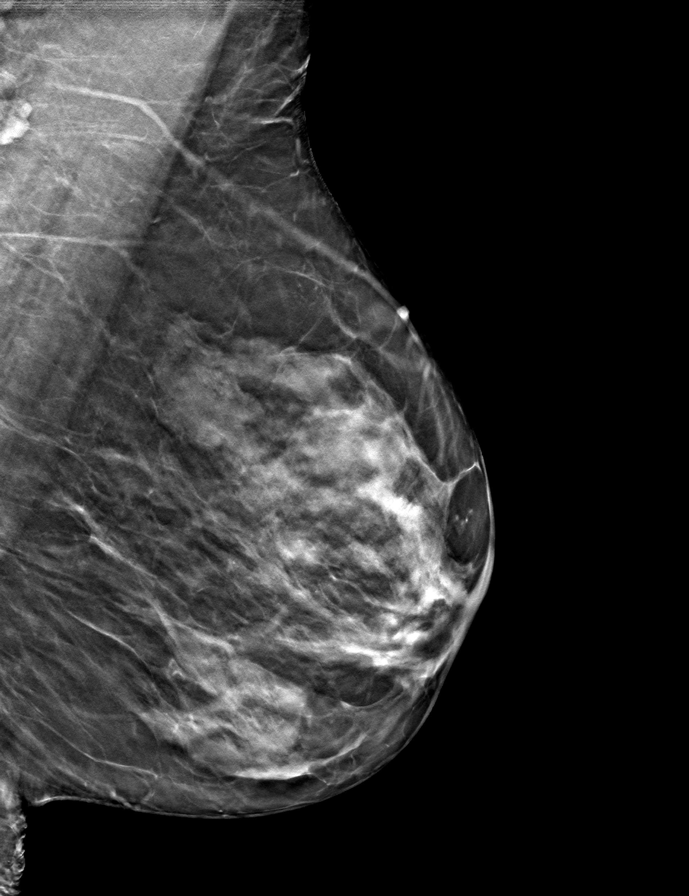
[frame 34/67]
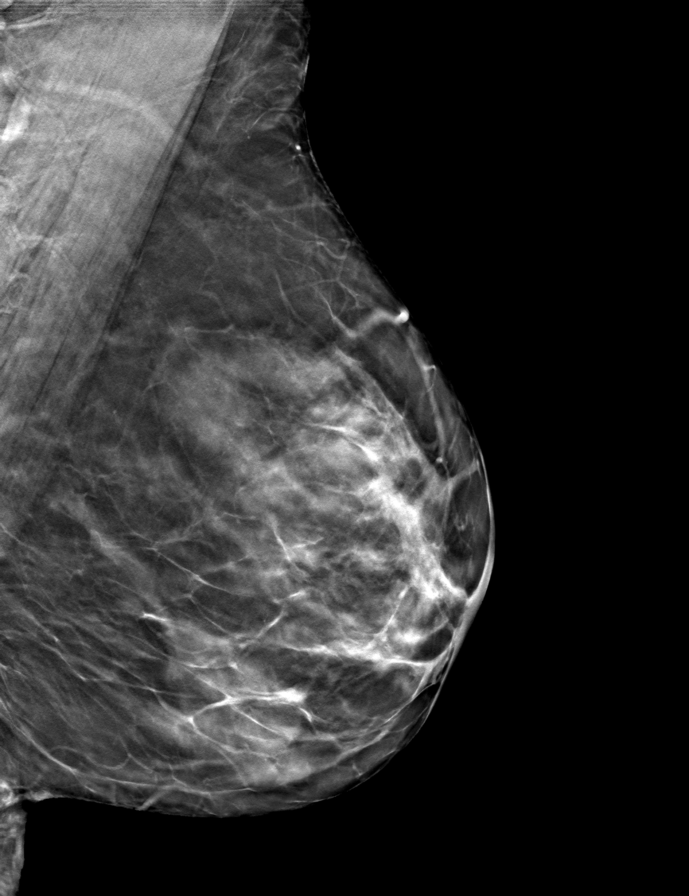

[R MLO tomo · tomo slice 33/65.0]
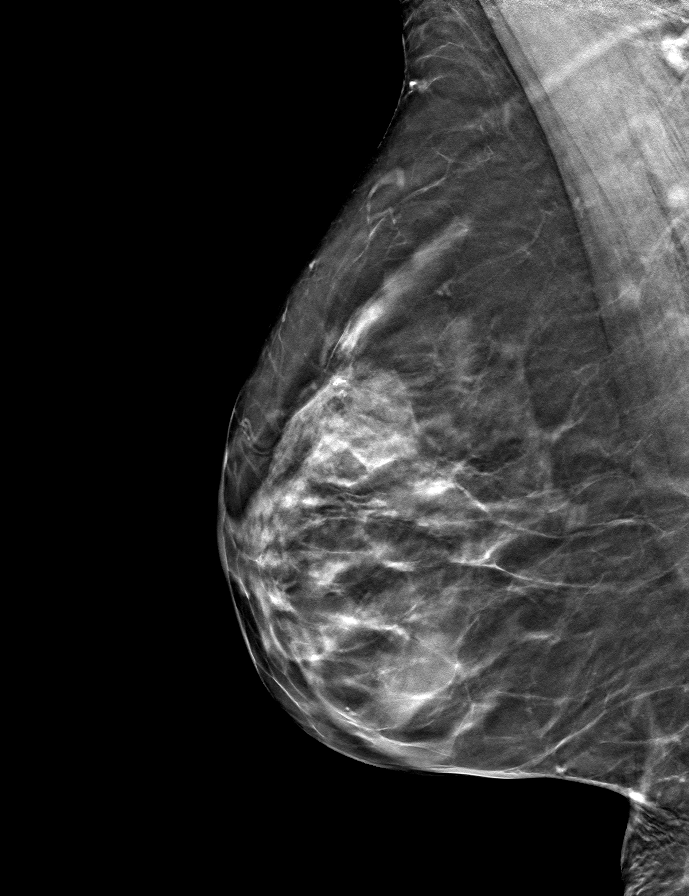

[R CC tomo · tomo slice 33/66.0]
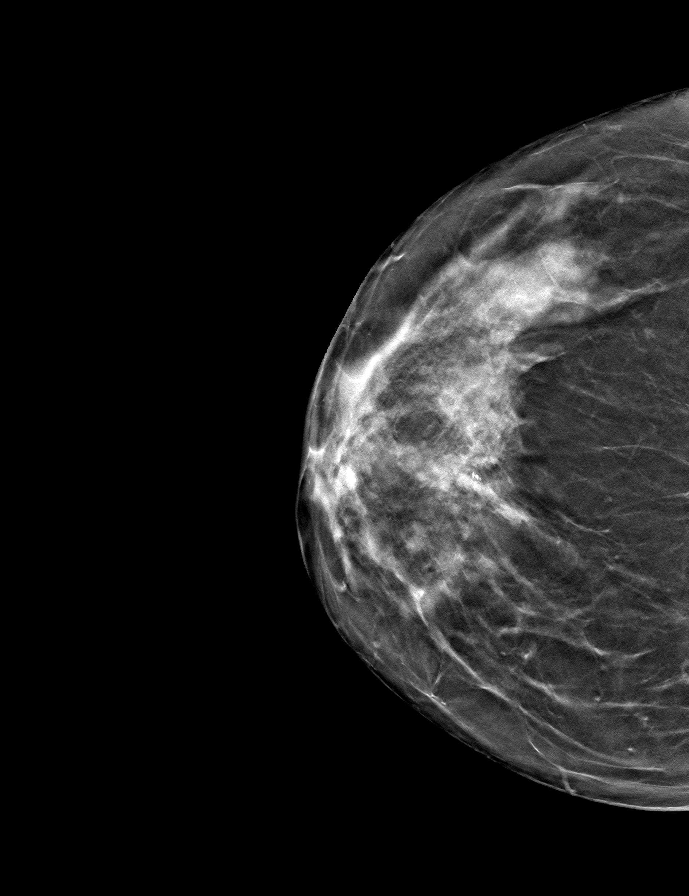

[L CC tomo · tomo slice 33/64.0]
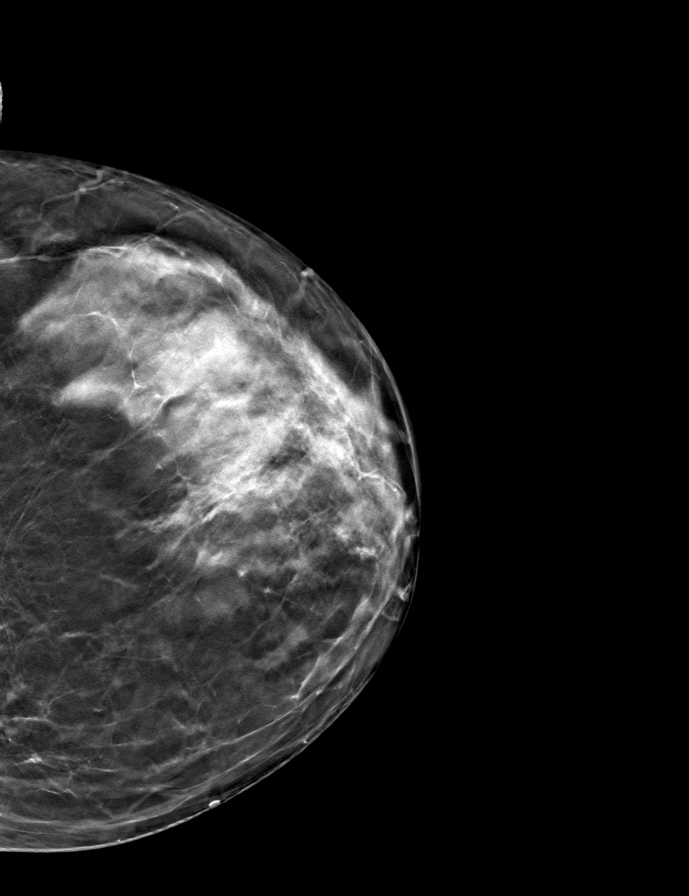

[9 of 24 positions shown; findings below may reference images not displayed]

ACR Breast Density Category c: The breast tissue is heterogeneously
dense, which may obscure small masses.
FINDINGS: There are no findings suspicious for malignancy.
IMPRESSION: No mammographic evidence of malignancy. A result letter of this
screening mammogram will be mailed directly to the patient.

RECOMMENDATION:
Screening mammogram in one year. (Code:Q3-W-BC3)

BI-RADS CATEGORY  1: Negative.

## 2024-03-30 ENCOUNTER — Other Ambulatory Visit: Payer: Self-pay | Admitting: Internal Medicine

## 2024-03-30 DIAGNOSIS — Z1231 Encounter for screening mammogram for malignant neoplasm of breast: Secondary | ICD-10-CM

## 2024-04-25 ENCOUNTER — Ambulatory Visit
Admission: RE | Admit: 2024-04-25 | Discharge: 2024-04-25 | Disposition: A | Discharge status: Home / Self Care | Source: Ambulatory Visit | Attending: Internal Medicine | Admitting: Internal Medicine

## 2024-04-25 DIAGNOSIS — Z1231 Encounter for screening mammogram for malignant neoplasm of breast: Secondary | ICD-10-CM

## 2024-05-23 ENCOUNTER — Other Ambulatory Visit: Payer: Self-pay

## 2024-05-23 DIAGNOSIS — Z1231 Encounter for screening mammogram for malignant neoplasm of breast: Secondary | ICD-10-CM

## 2025-04-26 ENCOUNTER — Ambulatory Visit
